# Patient Record
Sex: Male | Born: 1995 | Race: White | Hispanic: No | Marital: Single | Smoking: Never smoker
Health system: Southern US, Community
[De-identification: ages and names within clinical notes are randomized; demographics above are authoritative.]

---
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
## 2015-04-15 ENCOUNTER — Encounter: Payer: Self-pay | Admitting: Emergency Medicine

## 2015-04-15 ENCOUNTER — Emergency Department
Admission: EM | Admit: 2015-04-15 | Discharge: 2015-04-15 | Disposition: A | Discharge status: Home / Self Care | Payer: Medicaid Other | Attending: Emergency Medicine | Admitting: Emergency Medicine

## 2015-04-15 DIAGNOSIS — S40212A Abrasion of left shoulder, initial encounter: Secondary | ICD-10-CM | POA: Insufficient documentation

## 2015-04-15 DIAGNOSIS — Y9389 Activity, other specified: Secondary | ICD-10-CM | POA: Insufficient documentation

## 2015-04-15 DIAGNOSIS — S0101XA Laceration without foreign body of scalp, initial encounter: Secondary | ICD-10-CM | POA: Insufficient documentation

## 2015-04-15 DIAGNOSIS — Y998 Other external cause status: Secondary | ICD-10-CM | POA: Insufficient documentation

## 2015-04-15 DIAGNOSIS — Y92410 Unspecified street and highway as the place of occurrence of the external cause: Secondary | ICD-10-CM | POA: Diagnosis not present

## 2015-04-15 DIAGNOSIS — S0990XA Unspecified injury of head, initial encounter: Secondary | ICD-10-CM | POA: Diagnosis present

## 2015-04-15 DIAGNOSIS — T07XXXA Unspecified multiple injuries, initial encounter: Secondary | ICD-10-CM

## 2015-04-15 DIAGNOSIS — S59912A Unspecified injury of left forearm, initial encounter: Secondary | ICD-10-CM | POA: Insufficient documentation

## 2015-04-15 MED ORDER — LIDOCAINE-EPINEPHRINE-TETRACAINE (LET) SOLUTION
NASAL | Status: AC
  Administered 2015-04-15: 3 mL via TOPICAL
  Filled 2015-04-15: qty 3

## 2015-04-15 MED ORDER — ACETAMINOPHEN-CODEINE #3 300-30 MG PO TABS
ORAL_TABLET | ORAL | Status: AC
  Filled 2015-04-15: qty 1

## 2015-04-15 MED ORDER — CYCLOBENZAPRINE HCL 5 MG PO TABS: 5.0000 mg | ORAL_TABLET | Freq: Three times a day (TID) | ORAL | Status: AC | PRN

## 2015-04-15 MED ORDER — ACETAMINOPHEN-CODEINE #3 300-30 MG PO TABS: 1.0000 | ORAL_TABLET | Freq: Four times a day (QID) | ORAL | Status: AC | PRN

## 2015-04-15 MED ORDER — LIDOCAINE-EPINEPHRINE-TETRACAINE (LET) SOLUTION
3.0000 mL | Freq: Once | NASAL | Status: AC
  Administered 2015-04-15: 3 mL via TOPICAL

## 2015-04-15 MED ORDER — ACETAMINOPHEN-CODEINE #3 300-30 MG PO TABS
1.0000 | ORAL_TABLET | Freq: Once | ORAL | Status: AC
  Administered 2015-04-15: 1 via ORAL
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
## 2015-04-27 ENCOUNTER — Emergency Department
Admission: EM | Admit: 2015-04-27 | Discharge: 2015-04-27 | Disposition: A | Discharge status: Home / Self Care | Payer: Medicaid Other | Attending: Emergency Medicine | Admitting: Emergency Medicine

## 2015-04-27 ENCOUNTER — Encounter: Payer: Self-pay | Admitting: Emergency Medicine

## 2015-04-27 DIAGNOSIS — Z4802 Encounter for removal of sutures: Secondary | ICD-10-CM | POA: Diagnosis present
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
## 2017-10-18 ENCOUNTER — Emergency Department
Admission: EM | Admit: 2017-10-18 | Discharge: 2017-10-18 | Disposition: A | Discharge status: Home / Self Care | Payer: Self-pay | Attending: Emergency Medicine | Admitting: Emergency Medicine

## 2017-10-18 ENCOUNTER — Other Ambulatory Visit: Payer: Self-pay

## 2017-10-18 ENCOUNTER — Encounter: Payer: Self-pay | Admitting: Emergency Medicine

## 2017-10-18 DIAGNOSIS — B349 Viral infection, unspecified: Secondary | ICD-10-CM | POA: Insufficient documentation

## 2017-10-18 LAB — INFLUENZA PANEL BY PCR (TYPE A & B)
Influenza A By PCR: NEGATIVE
Influenza B By PCR: NEGATIVE

## 2017-10-18 LAB — GROUP A STREP BY PCR: Group A Strep by PCR: NOT DETECTED

## 2017-10-18 MED ORDER — PSEUDOEPH-BROMPHEN-DM 30-2-10 MG/5ML PO SYRP: 5.0000 mL | ORAL_SOLUTION | Freq: Four times a day (QID) | ORAL | 0 refills | Status: AC | PRN

## 2017-10-18 MED ORDER — LIDOCAINE VISCOUS 2 % MT SOLN: 5.0000 mL | Freq: Four times a day (QID) | OROMUCOSAL | 0 refills | Status: AC | PRN

## 2017-10-18 MED ORDER — IBUPROFEN 600 MG PO TABS: 600.0000 mg | ORAL_TABLET | Freq: Three times a day (TID) | ORAL | 0 refills | Status: AC | PRN

## 2017-10-18 NOTE — ED Triage Notes (Signed)
Tuesday, flu and strep test negative.

## 2017-10-18 NOTE — ED Provider Notes (Signed)
[facility] Emergency Department Provider Note   ____________________________________________   First MD Initiated Contact with Patient 10/18/17 [phone]     (approximate)  I have reviewed the triage vital signs and the nursing notes.   HISTORY  Chief Complaint Sore Throat   HPI [name] is a 22 y.o. male patient complained of fever, sore throat, and body aches for 3 days.  Patient seen 2 days ago at [facility] and was negative for flu and strep.  Patient described his pain as "achy".  Patient not taking flu shot for this season.  Patient denies nausea, vomiting, diarrhea.  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   History reviewed. No pertinent past medical history.  There are no active problems to display for this patient.   History reviewed. No pertinent surgical history.  Prior to Admission medications   Medication Sig Start Date End Date Taking? Authorizing Provider  acetaminophen-codeine (TYLENOL #3) 300-30 MG per tablet Take 1 tablet by mouth every 6 (six) hours as needed for moderate pain. 04/15/15   Menshew, Charlesetta Ivory, PA-C  cyclobenzaprine (FLEXERIL) 5 MG tablet Take 1 tablet (5 mg total) by mouth 3 (three) times daily as needed for muscle spasms. 04/15/15   Menshew, Charlesetta Ivory, PA-C    Allergies Patient has no known allergies.  No family history on file.  Social History Social History   Tobacco Use  . Smoking status: Never Smoker  . Smokeless tobacco: Never Used  Substance Use Topics  . Alcohol use: No  . Drug use: No    Review of Systems Constitutional: Fever/chills and body aches. Eyes: No visual changes. ENT: Sore throat.  Nasal congestion. Cardiovascular: Denies chest pain. Respiratory: Denies shortness of breath.  Nonproductive cough. Gastrointestinal: No abdominal pain.  No nausea, no vomiting.  No diarrhea.  No constipation. Genitourinary: Negative for dysuria. Musculoskeletal: Negative for  back pain. Skin: Negative for rash. Neurological: Negative for headaches, focal weakness or numbness.   ____________________________________________   PHYSICAL EXAM:  VITAL SIGNS: ED Triage Vitals  Enc Vitals Group     BP 10/18/17 0928 (!) 141/70     Pulse Rate 10/18/17 0928 (!) 104     Resp 10/18/17 0928 20     Temp 10/18/17 0928 (!) 100.8 F (38.2 C)     Temp Source 10/18/17 0928 Oral     SpO2 10/18/17 0928 96 %     Weight 10/18/17 0928 226 lb (102.5 kg)     Height 10/18/17 0928 5\' 8"  (1.727 m)     Head Circumference --      Peak Flow --      Pain Score 10/18/17 0932 7     Pain Loc --      Pain Edu? --      Excl. in GC? --    Constitutional: Alert and oriented. Well appearing and in no acute distress.  Febrile. Nose: Nasal congestion runny nose.  Mouth/Throat: Mucous membranes are moist.  Oropharynx erythematous. Neck: No stridor. Hematological/Lymphatic/Immunilogical: No cervical lymphadenopathy. Cardiovascular: Normal rate, regular rhythm. Grossly normal heart sounds.  Good peripheral circulation. Respiratory: Normal respiratory effort.  No retractions. Lungs CTAB. usculoskeletal: No lower extremity tenderness nor edema.  No joint effusions. Neurologic:  Normal speech and language. No gross focal neurologic deficits are appreciated. No gait instability. Skin:  Skin is warm, dry and intact. No rash noted. Psychiatric: Mood and affect are normal. Speech and behavior are normal.  ____________________________________________  LABS (all labs ordered are listed, but only abnormal results are displayed)  Labs Reviewed  GROUP A STREP BY PCR  INFLUENZA PANEL BY PCR (TYPE A & B)   ____________________________________________  EKG   ____________________________________________  RADIOLOGY  ED MD interpretation:    Official radiology report(s): No results found.  ____________________________________________   PROCEDURES  Procedure(s) performed:  None  Procedures  Critical Care performed: No  ____________________________________________   INITIAL IMPRESSION / ASSESSMENT AND PLAN / ED COURSE  As part of my medical decision making, I reviewed the following data within the electronic MEDICAL RECORD NUMBER    Viral illness.  Discussed negative strep and flu results with patient.  Patient given discharge care instruction work note.  Patient advised follow-up PCP if condition persists.      ____________________________________________   FINAL CLINICAL IMPRESSION(S) / ED DIAGNOSES  Final diagnoses:  Viral illness     ED Discharge Orders    None       Note:  This document was prepared using Dragon voice recognition software and may include unintentional dictation errors.    Joni ReiningSmith, Estanislao Harmon K, PA-C 10/18/17 1123    Jene EveryKinner, Robert, MD 10/18/17 1258

## 2017-10-18 NOTE — ED Notes (Signed)
See triage note  States he developed body aches with sore throat since last Saturday. Was seen by PCP and tested negative for flu and strep   But conts to have fever and some body aches at home

## 2017-10-18 NOTE — ED Triage Notes (Signed)
Fever and sore throat for the past few days.

## 2017-10-18 NOTE — ED Notes (Signed)
First Nurse Note:  Patient tested negative for flu but here today because he is not feeling any better.  Face mask placed.
